# Patient Record
Sex: Female | Born: 1960 | Race: White | Hispanic: No | Marital: Married
Health system: Southern US, Community
[De-identification: ages and names within clinical notes are randomized; demographics above are authoritative.]

## PROBLEM LIST (undated history)

## (undated) DIAGNOSIS — L570 Actinic keratosis: Secondary | ICD-10-CM

## (undated) HISTORY — DX: Actinic keratosis: L57.0

---

## 2004-09-06 ENCOUNTER — Ambulatory Visit: Payer: Self-pay | Admitting: Obstetrics and Gynecology

## 2005-09-28 ENCOUNTER — Ambulatory Visit: Payer: Self-pay | Admitting: Obstetrics and Gynecology

## 2006-10-04 ENCOUNTER — Ambulatory Visit: Payer: Self-pay | Admitting: Obstetrics and Gynecology

## 2007-10-09 ENCOUNTER — Ambulatory Visit: Payer: Self-pay | Admitting: Obstetrics and Gynecology

## 2008-10-22 ENCOUNTER — Ambulatory Visit: Payer: Self-pay | Admitting: Unknown Physician Specialty

## 2009-11-05 ENCOUNTER — Ambulatory Visit: Payer: Self-pay | Admitting: Unknown Physician Specialty

## 2010-01-18 ENCOUNTER — Ambulatory Visit: Payer: Self-pay | Admitting: Unknown Physician Specialty

## 2010-04-01 ENCOUNTER — Ambulatory Visit: Payer: Self-pay | Admitting: Internal Medicine

## 2010-06-24 ENCOUNTER — Ambulatory Visit: Payer: Self-pay | Admitting: Surgery

## 2010-06-28 LAB — PATHOLOGY REPORT

## 2010-08-05 ENCOUNTER — Ambulatory Visit: Payer: Self-pay | Admitting: Urology

## 2010-12-07 ENCOUNTER — Ambulatory Visit: Payer: Self-pay | Admitting: Unknown Physician Specialty

## 2010-12-27 ENCOUNTER — Ambulatory Visit: Payer: Self-pay | Admitting: Internal Medicine

## 2011-12-13 ENCOUNTER — Ambulatory Visit: Payer: Self-pay | Admitting: Obstetrics and Gynecology

## 2012-05-09 ENCOUNTER — Ambulatory Visit: Payer: Self-pay | Admitting: Internal Medicine

## 2012-07-13 ENCOUNTER — Ambulatory Visit: Payer: Self-pay | Admitting: Obstetrics and Gynecology

## 2013-02-12 ENCOUNTER — Ambulatory Visit: Payer: Self-pay | Admitting: Obstetrics and Gynecology

## 2013-05-13 ENCOUNTER — Ambulatory Visit
Admission: RE | Admit: 2013-05-13 | Discharge: 2013-05-13 | Disposition: A | Discharge status: Home / Self Care | Payer: BC Managed Care – PPO | Source: Ambulatory Visit | Attending: Allergy and Immunology | Admitting: Allergy and Immunology

## 2013-05-13 ENCOUNTER — Other Ambulatory Visit: Payer: Self-pay | Admitting: Allergy and Immunology

## 2013-05-13 DIAGNOSIS — R059 Cough, unspecified: Secondary | ICD-10-CM

## 2013-05-13 DIAGNOSIS — R05 Cough: Secondary | ICD-10-CM

## 2014-02-17 ENCOUNTER — Ambulatory Visit: Payer: Self-pay | Admitting: Obstetrics and Gynecology

## 2014-02-18 ENCOUNTER — Ambulatory Visit: Payer: Self-pay | Admitting: Obstetrics and Gynecology

## 2014-02-19 ENCOUNTER — Ambulatory Visit: Payer: Self-pay | Admitting: Obstetrics and Gynecology

## 2014-02-19 HISTORY — PX: BREAST BIOPSY: SHX20

## 2014-03-02 ENCOUNTER — Ambulatory Visit: Payer: Self-pay | Admitting: Surgery

## 2014-03-13 ENCOUNTER — Ambulatory Visit: Payer: Self-pay | Admitting: Surgery

## 2014-03-13 HISTORY — PX: BREAST LUMPECTOMY W/ NEEDLE LOCALIZATION: SHX1266

## 2014-03-13 HISTORY — PX: BREAST EXCISIONAL BIOPSY: SUR124

## 2014-05-04 LAB — SURGICAL PATHOLOGY

## 2014-05-10 NOTE — Op Note (Signed)
PATIENT NAME:  Wanda Taylor, Wanda Taylor MR#:  [id number] DATE OF BIRTH:  1960-12-21  DATE OF PROCEDURE:  03/13/2014  PREOPERATIVE DIAGNOSIS: Intraductal papilloma of left breast.   POSTOPERATIVE DIAGNOSIS: Intraductal papilloma of left breast.   PROCEDURE: Excision, left breast mass.   SURGEON: Loreli Dollar, MD   ANESTHESIA: General.   INDICATIONS: This 54 year old female recently had a mammogram depicting a small cluster of microcalcifications in the retroareolar portion of the left breast. A core biopsy demonstrated intraductal papilloma and fibrocystic changes and mild hyperplasia. Excision was recommended for further evaluation and treatment.   DESCRIPTION OF PROCEDURE: The patient was placed on the operating table in the supine position under general anesthesia. The dressing was removed from the left breast, exposing a Kopans wire, which entered the breast at the border of the areola in the lower outer quadrant. Her mammogram images were reviewed. The wire was cut 2 cm from the skin. The breast was prepared with ChloraPrep and draped in a sterile manner.   A curvilinear incision was made at the border of the areola from approximately 3-o'clock to 6 o'clock position, carried down through subcutaneous tissues. Several small bleeding points were cauterized. Dissection was carried down to encounter the wire. There was also some palpable firmness within the tissue and appearance of dense tissue, also a number of small cysts were encountered. There was also some old blood, which was aspirated. The dissection was carried out around the thick portion of the wire and removed a portion of tissue, which was approximately 1.2 x 1.2 x 2 cm in dimension and was oriented so that a nylon suture was placed on the anterior border and recorded on the pathology requisition that the barb was medially located and submitted this for specimen mammogram and pathology.   The wound was inspected and several small bleeding  points were cauterized. There was somewhat dense breast tissue noted. There appeared to be no other visible or palpable nodules. Next, the subcutaneous tissues were infiltrated with 0.5% Sensorcaine with epinephrine. The subcutaneous tissues were approximated with interrupted 4-0 chromic. The skin was closed with a running 4-0 Monocryl subcuticular suture and LiquiBand. The patient tolerated surgery satisfactorily and was prepared for transfer to the recovery room.     ____________________________ Lenna Sciara. Rochel Brome, MD jws:mw D: 03/13/2014 13:43:36 ET T: 03/13/2014 15:21:10 ET JOB#: [id number]  cc: Loreli Dollar, MD, <Dictator> Loreli Dollar MD ELECTRONICALLY SIGNED 03/19/2014 13:38

## 2015-02-11 ENCOUNTER — Other Ambulatory Visit: Payer: Self-pay | Admitting: Obstetrics and Gynecology

## 2015-02-11 DIAGNOSIS — Z1231 Encounter for screening mammogram for malignant neoplasm of breast: Secondary | ICD-10-CM

## 2015-02-18 ENCOUNTER — Ambulatory Visit
Admission: RE | Admit: 2015-02-18 | Discharge: 2015-02-18 | Disposition: A | Discharge status: Home / Self Care | Payer: BLUE CROSS/BLUE SHIELD | Source: Ambulatory Visit | Attending: Obstetrics and Gynecology | Admitting: Obstetrics and Gynecology

## 2015-02-18 DIAGNOSIS — Z1231 Encounter for screening mammogram for malignant neoplasm of breast: Secondary | ICD-10-CM | POA: Diagnosis not present

## 2015-12-24 ENCOUNTER — Telehealth: Payer: Self-pay | Admitting: Allergy and Immunology

## 2015-12-24 NOTE — Telephone Encounter (Signed)
Called patient and advised that she would need a follow up appointment; then we could take care of any refills that the patient may need. Patient stated that she would call back to make an appointment.

## 2015-12-24 NOTE — Telephone Encounter (Signed)
Pt called and would like a refill on her inhale. Wanda Taylor [phone]

## 2016-01-18 ENCOUNTER — Other Ambulatory Visit: Payer: Self-pay | Admitting: Internal Medicine

## 2016-01-18 DIAGNOSIS — Z1231 Encounter for screening mammogram for malignant neoplasm of breast: Secondary | ICD-10-CM

## 2016-02-22 ENCOUNTER — Ambulatory Visit
Admission: RE | Admit: 2016-02-22 | Discharge: 2016-02-22 | Disposition: A | Discharge status: Home / Self Care | Payer: 59 | Source: Ambulatory Visit | Attending: Internal Medicine | Admitting: Internal Medicine

## 2016-02-22 DIAGNOSIS — Z1231 Encounter for screening mammogram for malignant neoplasm of breast: Secondary | ICD-10-CM | POA: Diagnosis present

## 2016-03-01 DIAGNOSIS — D229 Melanocytic nevi, unspecified: Secondary | ICD-10-CM

## 2016-03-01 HISTORY — DX: Melanocytic nevi, unspecified: D22.9

## 2016-06-30 IMAGING — MG MM DIGITAL SCREENING BILAT W/ CAD
4 series · 4 of 4 positions shown · non-contrast
Comparison: Previous exam(s).

CLINICAL DATA: Screening.

EXAM:
DIGITAL SCREENING BILATERAL MAMMOGRAM WITH CAD

[R CC (1 of 2)]
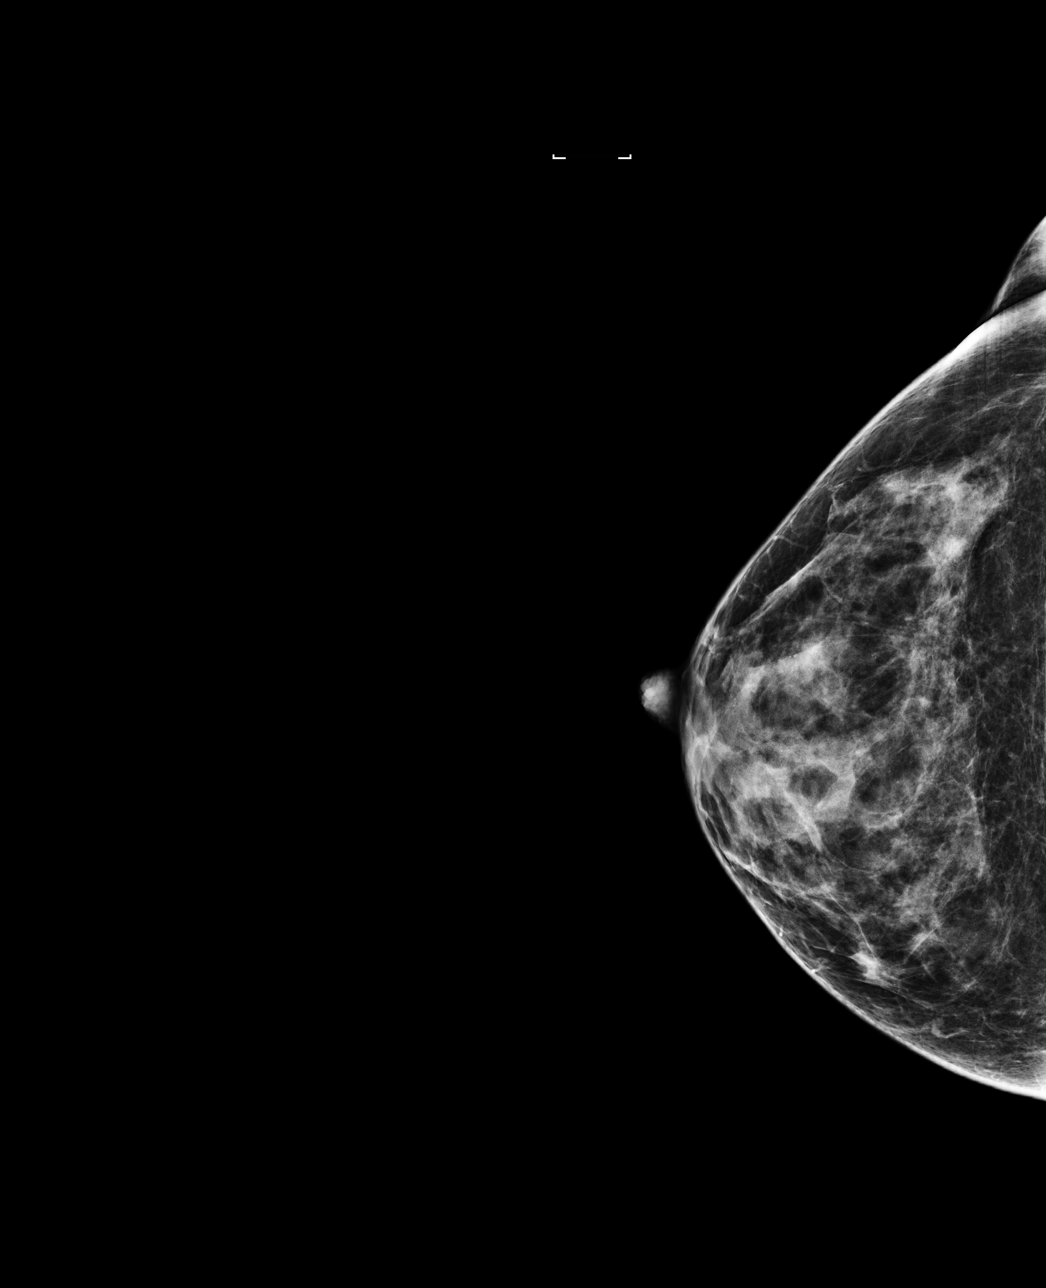

[R CC (2 of 2)]
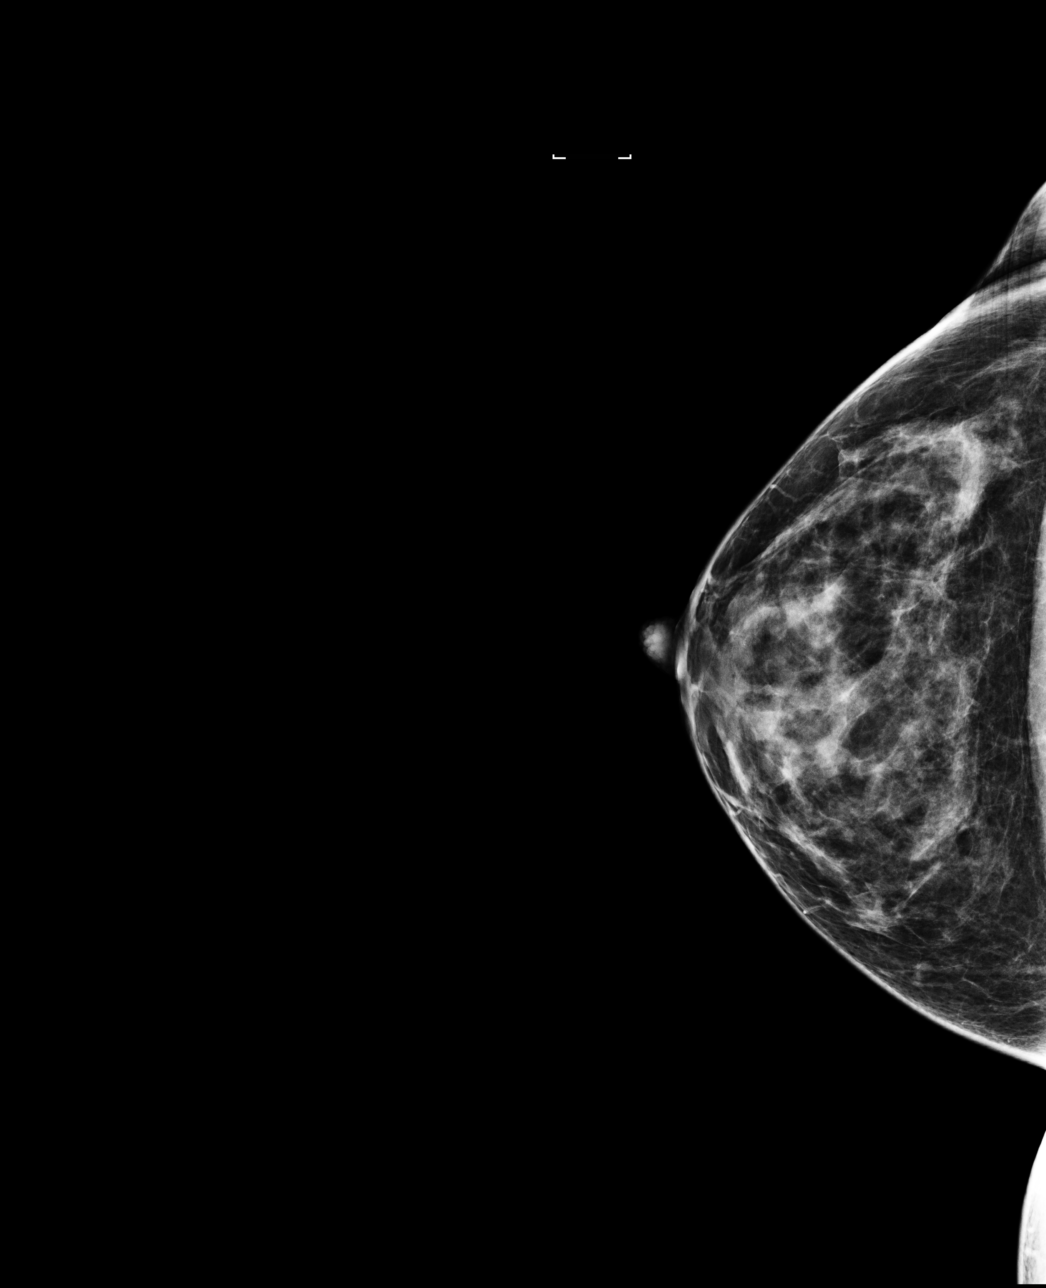

[L MLO]
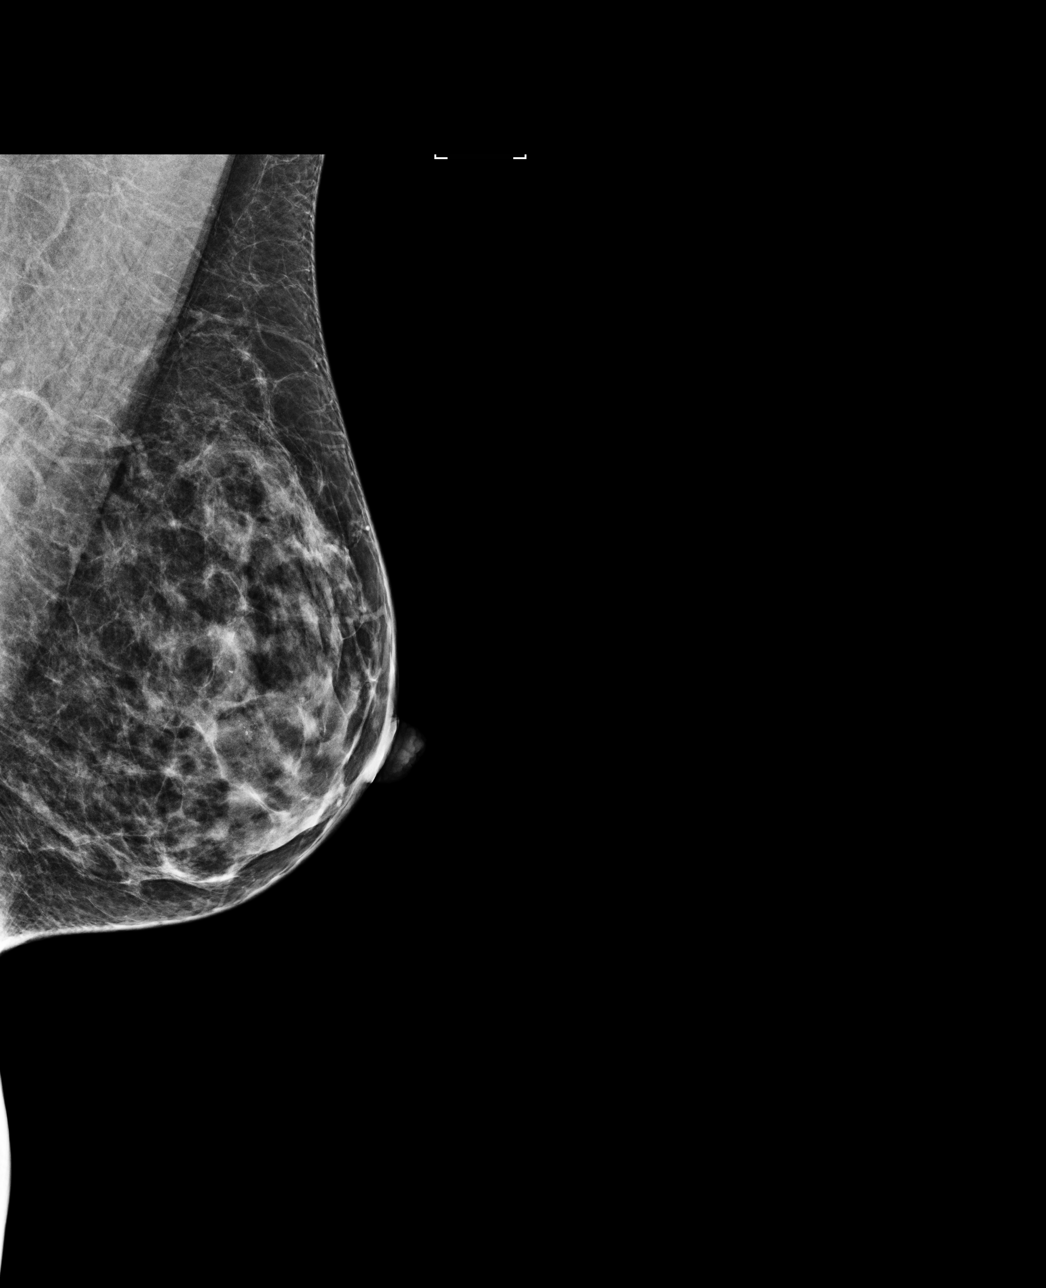

[R MLO]
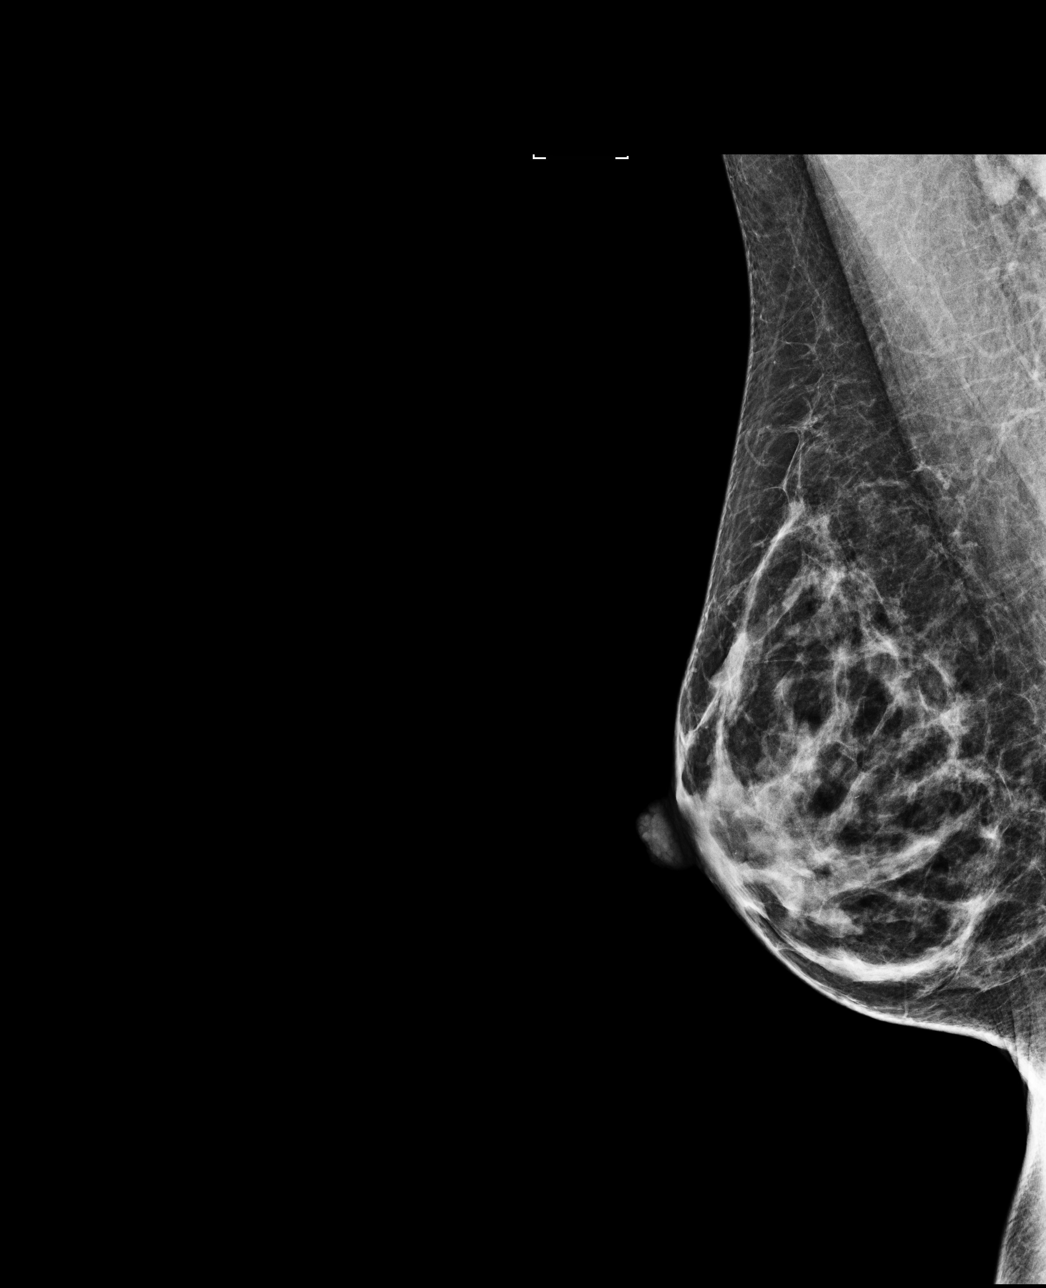

[4 of 4 positions shown; findings below may reference images not displayed]

ACR Breast Density Category c: The breast tissue is heterogeneously
dense, which may obscure small masses.
FINDINGS: In the left breast, calcifications warrant further evaluation with
magnified views. In the right breast, no findings suspicious for
malignancy. Images were processed with CAD.
IMPRESSION: Further evaluation is suggested for calcifications in the left
breast.

RECOMMENDATION:
Diagnostic mammogram of the left breast. (Code:[id number])

The patient will be contacted regarding the findings, and additional
imaging will be scheduled.

BI-RADS CATEGORY  0: Incomplete. Need additional imaging evaluation
and/or prior mammograms for comparison.

## 2016-07-02 IMAGING — MG MM POST US BIOPSY *L*
1 series · 2 of 2 positions shown · non-contrast
Comparison: Previous exam(s).

CLINICAL DATA: Left breast stereotactic biopsy.

EXAM:
DIAGNOSTIC left breast MAMMOGRAM POST stereotactic BIOPSY

[L CC · left · 2 of 2 slices shown]
[im 1/2]
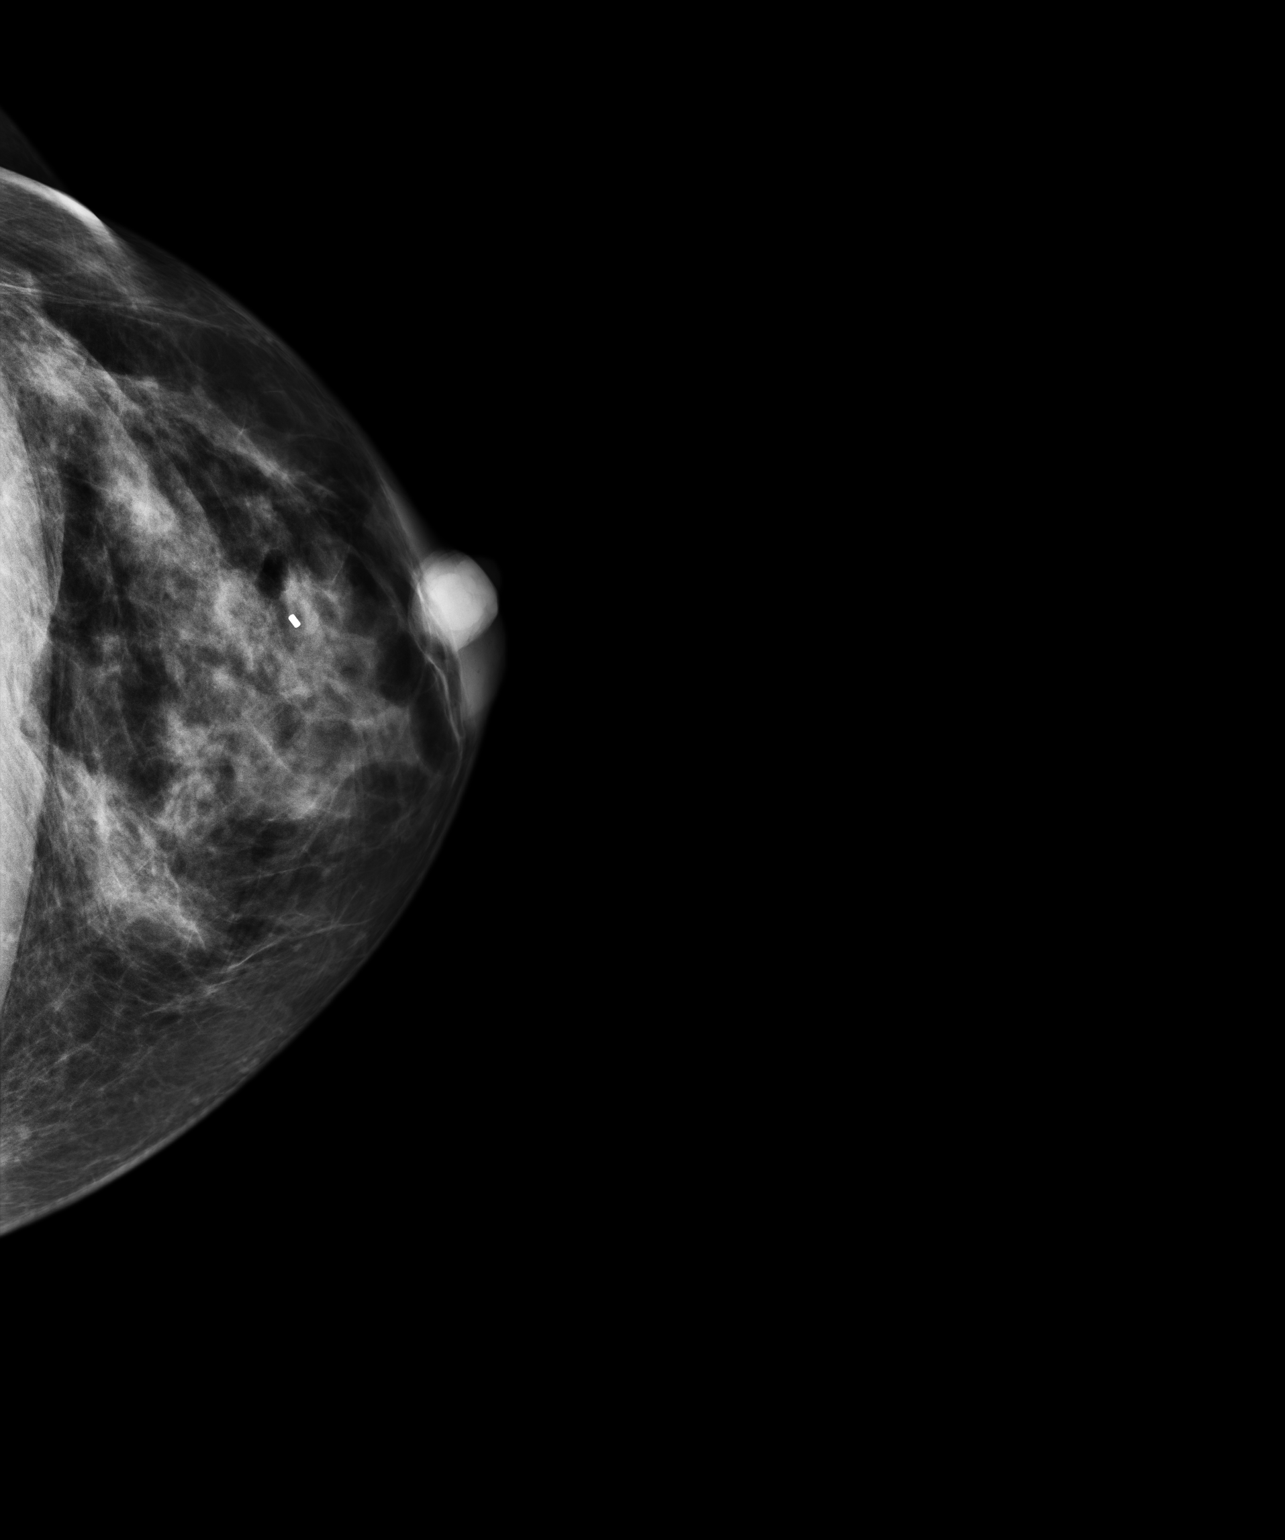
[im 2/2]
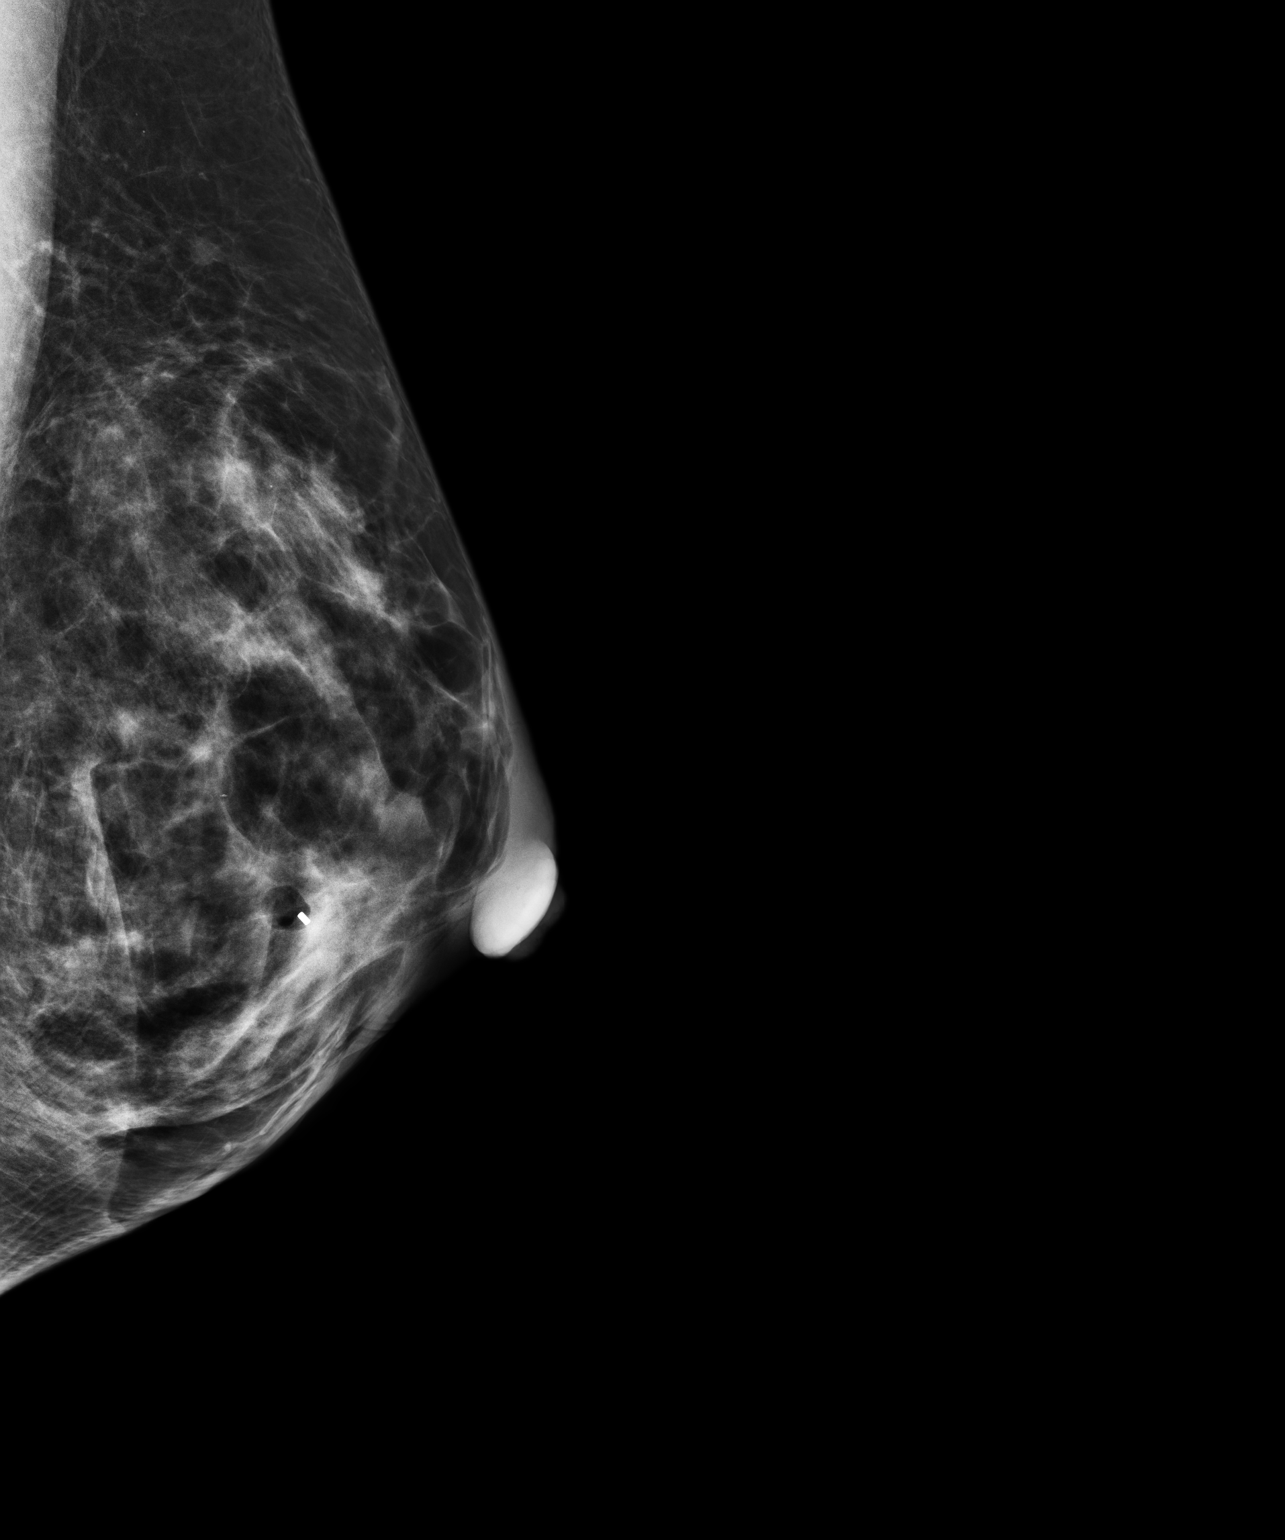

[2 of 2 positions shown; findings below may reference images not displayed]

FINDINGS: Mammographic images were obtained following stereotactic guided
biopsy of the area of calcifications located within the subareolar
portion of the left breast. The cylinder-shaped clip is in
appropriate position.
IMPRESSION: Appropriate position of clip following left breast stereotactic
biopsy.

Final Assessment: Post Procedure Mammograms for Marker Placement

## 2017-01-16 ENCOUNTER — Other Ambulatory Visit: Payer: Self-pay | Admitting: Internal Medicine

## 2017-01-16 DIAGNOSIS — Z1231 Encounter for screening mammogram for malignant neoplasm of breast: Secondary | ICD-10-CM

## 2017-02-27 ENCOUNTER — Ambulatory Visit
Admission: RE | Admit: 2017-02-27 | Discharge: 2017-02-27 | Disposition: A | Discharge status: Home / Self Care | Payer: Self-pay | Source: Ambulatory Visit | Attending: Internal Medicine | Admitting: Internal Medicine

## 2017-02-27 ENCOUNTER — Other Ambulatory Visit: Payer: Self-pay | Admitting: Internal Medicine

## 2017-02-27 DIAGNOSIS — Z1231 Encounter for screening mammogram for malignant neoplasm of breast: Secondary | ICD-10-CM | POA: Insufficient documentation

## 2018-04-09 ENCOUNTER — Other Ambulatory Visit: Payer: Self-pay | Admitting: Internal Medicine

## 2018-04-09 DIAGNOSIS — Z1231 Encounter for screening mammogram for malignant neoplasm of breast: Secondary | ICD-10-CM

## 2018-06-13 ENCOUNTER — Other Ambulatory Visit: Payer: Self-pay

## 2018-06-13 ENCOUNTER — Ambulatory Visit
Admission: RE | Admit: 2018-06-13 | Discharge: 2018-06-13 | Disposition: A | Discharge status: Home / Self Care | Payer: 59 | Source: Ambulatory Visit | Attending: Internal Medicine | Admitting: Internal Medicine

## 2018-06-13 DIAGNOSIS — Z1231 Encounter for screening mammogram for malignant neoplasm of breast: Secondary | ICD-10-CM | POA: Insufficient documentation

## 2019-05-08 ENCOUNTER — Other Ambulatory Visit: Payer: Self-pay | Admitting: Internal Medicine

## 2019-05-08 DIAGNOSIS — Z1231 Encounter for screening mammogram for malignant neoplasm of breast: Secondary | ICD-10-CM

## 2019-06-16 ENCOUNTER — Ambulatory Visit
Admission: RE | Admit: 2019-06-16 | Discharge: 2019-06-16 | Disposition: A | Discharge status: Home / Self Care | Payer: 59 | Source: Ambulatory Visit | Attending: Internal Medicine | Admitting: Internal Medicine

## 2019-06-16 DIAGNOSIS — Z1231 Encounter for screening mammogram for malignant neoplasm of breast: Secondary | ICD-10-CM | POA: Diagnosis not present

## 2020-05-10 ENCOUNTER — Other Ambulatory Visit: Payer: Self-pay | Admitting: Internal Medicine

## 2020-05-10 DIAGNOSIS — Z1231 Encounter for screening mammogram for malignant neoplasm of breast: Secondary | ICD-10-CM

## 2020-07-05 ENCOUNTER — Ambulatory Visit
Admission: RE | Admit: 2020-07-05 | Discharge: 2020-07-05 | Disposition: A | Discharge status: Home / Self Care | Payer: 59 | Source: Ambulatory Visit | Attending: Internal Medicine | Admitting: Internal Medicine

## 2020-07-05 ENCOUNTER — Other Ambulatory Visit: Payer: Self-pay

## 2020-07-05 DIAGNOSIS — Z1231 Encounter for screening mammogram for malignant neoplasm of breast: Secondary | ICD-10-CM | POA: Diagnosis not present

## 2021-03-08 ENCOUNTER — Other Ambulatory Visit: Payer: Self-pay | Admitting: Internal Medicine

## 2021-03-08 DIAGNOSIS — N6323 Unspecified lump in the left breast, lower outer quadrant: Secondary | ICD-10-CM

## 2021-03-24 ENCOUNTER — Ambulatory Visit
Admission: RE | Admit: 2021-03-24 | Discharge: 2021-03-24 | Disposition: A | Discharge status: Home / Self Care | Payer: 59 | Source: Ambulatory Visit | Attending: Internal Medicine | Admitting: Internal Medicine

## 2021-03-24 DIAGNOSIS — N6323 Unspecified lump in the left breast, lower outer quadrant: Secondary | ICD-10-CM | POA: Insufficient documentation

## 2021-05-04 ENCOUNTER — Other Ambulatory Visit: Payer: Self-pay | Admitting: Internal Medicine

## 2021-05-04 DIAGNOSIS — Z1231 Encounter for screening mammogram for malignant neoplasm of breast: Secondary | ICD-10-CM

## 2021-07-13 ENCOUNTER — Ambulatory Visit
Admission: RE | Admit: 2021-07-13 | Discharge: 2021-07-13 | Disposition: A | Discharge status: Home / Self Care | Payer: 59 | Source: Ambulatory Visit | Attending: Internal Medicine | Admitting: Internal Medicine

## 2021-07-13 DIAGNOSIS — Z1231 Encounter for screening mammogram for malignant neoplasm of breast: Secondary | ICD-10-CM | POA: Insufficient documentation

## 2021-10-05 ENCOUNTER — Encounter: Payer: Self-pay | Admitting: Dermatology

## 2021-10-05 ENCOUNTER — Ambulatory Visit (INDEPENDENT_AMBULATORY_CARE_PROVIDER_SITE_OTHER): Payer: Commercial Managed Care - PPO | Admitting: Dermatology

## 2021-10-05 DIAGNOSIS — D1801 Hemangioma of skin and subcutaneous tissue: Secondary | ICD-10-CM

## 2021-10-05 DIAGNOSIS — Z1283 Encounter for screening for malignant neoplasm of skin: Secondary | ICD-10-CM

## 2021-10-05 DIAGNOSIS — L578 Other skin changes due to chronic exposure to nonionizing radiation: Secondary | ICD-10-CM | POA: Diagnosis not present

## 2021-10-05 DIAGNOSIS — Z86018 Personal history of other benign neoplasm: Secondary | ICD-10-CM

## 2021-10-05 DIAGNOSIS — L570 Actinic keratosis: Secondary | ICD-10-CM

## 2021-10-05 DIAGNOSIS — D229 Melanocytic nevi, unspecified: Secondary | ICD-10-CM

## 2021-10-05 DIAGNOSIS — Z808 Family history of malignant neoplasm of other organs or systems: Secondary | ICD-10-CM

## 2021-10-05 DIAGNOSIS — L814 Other melanin hyperpigmentation: Secondary | ICD-10-CM | POA: Diagnosis not present

## 2021-10-05 DIAGNOSIS — L821 Other seborrheic keratosis: Secondary | ICD-10-CM

## 2021-10-05 DIAGNOSIS — D239 Other benign neoplasm of skin, unspecified: Secondary | ICD-10-CM

## 2021-10-05 NOTE — Patient Instructions (Signed)
Recommend daily broad spectrum sunscreen SPF 30+ to sun-exposed areas, reapply every 2 hours as needed. Call for new or changing lesions.  Staying in the shade or wearing long sleeves, sun glasses (UVA+UVB protection) and wide brim hats (4-inch brim around the entire circumference of the hat) are also recommended for sun protection.    Melanoma ABCDEs  Melanoma is the most dangerous type of skin cancer, and is the leading cause of death from skin disease.  You are more likely to develop melanoma if you: Have light-colored skin, light-colored eyes, or red or blond hair Spend a lot of time in the sun Tan regularly, either outdoors or in a tanning bed Have had blistering sunburns, especially during childhood Have a close family member who has had a melanoma Have atypical moles or large birthmarks  Early detection of melanoma is key since treatment is typically straightforward and cure rates are extremely high if we catch it early.   The first sign of melanoma is often a change in a mole or a new dark spot.  The ABCDE system is a way of remembering the signs of melanoma.  A for asymmetry:  The two halves do not match. B for border:  The edges of the growth are irregular. C for color:  A mixture of colors are present instead of an even brown color. D for diameter:  Melanomas are usually (but not always) greater than 6mm - the size of a pencil eraser. E for evolution:  The spot keeps changing in size, shape, and color.  Please check your skin once per month between visits. You can use a small mirror in front and a large mirror behind you to keep an eye on the back side or your body.   If you see any new or changing lesions before your next follow-up, please call to schedule a visit.  Please continue daily skin protection including broad spectrum sunscreen SPF 30+ to sun-exposed areas, reapplying every 2 hours as needed when you're outdoors.   Staying in the shade or wearing long sleeves, sun  glasses (UVA+UVB protection) and wide brim hats (4-inch brim around the entire circumference of the hat) are also recommended for sun protection.     Due to recent changes in healthcare laws, you may see results of your pathology and/or laboratory studies on MyChart before the doctors have had a chance to review them. We understand that in some cases there may be results that are confusing or concerning to you. Please understand that not all results are received at the same time and often the doctors may need to interpret multiple results in order to provide you with the best plan of care or course of treatment. Therefore, we ask that you please give us 2 business days to thoroughly review all your results before contacting the office for clarification. Should we see a critical lab result, you will be contacted sooner.   If You Need Anything After Your Visit  If you have any questions or concerns for your doctor, please call our main line at [phone] and press option 4 to reach your doctor's medical assistant. If no one answers, please leave a voicemail as directed and we will return your call as soon as possible. Messages left after 4 pm will be answered the following business day.   You may also send us a message via MyChart. We typically respond to MyChart messages within 1-2 business days.  For prescription refills, please ask your pharmacy to contact   our office. Our fax number is [phone].  If you have an urgent issue when the clinic is closed that cannot wait until the next business day, you can page your doctor at the number below.    Please note that while we do our best to be available for urgent issues outside of office hours, we are not available 24/7.   If you have an urgent issue and are unable to reach us, you may choose to seek medical care at your doctor's office, retail clinic, urgent care center, or emergency room.  If you have a medical emergency, please immediately  call 911 or go to the emergency department.  Pager Numbers  - Dr. Kowalski: [phone]  - Dr. Moye: [phone]  - Dr. Stewart: [phone]  In the event of inclement weather, please call our main line at [phone] for an update on the status of any delays or closures.  Dermatology Medication Tips: Please keep the boxes that topical medications come in in order to help keep track of the instructions about where and how to use these. Pharmacies typically print the medication instructions only on the boxes and not directly on the medication tubes.   If your medication is too expensive, please contact our office at [phone] option 4 or send us a message through MyChart.   We are unable to tell what your co-pay for medications will be in advance as this is different depending on your insurance coverage. However, we may be able to find a substitute medication at lower cost or fill out paperwork to get insurance to cover a needed medication.   If a prior authorization is required to get your medication covered by your insurance company, please allow us 1-2 business days to complete this process.  Drug prices often vary depending on where the prescription is filled and some pharmacies may offer cheaper prices.  The website www.goodrx.com contains coupons for medications through different pharmacies. The prices here do not account for what the cost may be with help from insurance (it may be cheaper with your insurance), but the website can give you the price if you did not use any insurance.  - You can print the associated coupon and take it with your prescription to the pharmacy.  - You may also stop by our office during regular business hours and pick up a GoodRx coupon card.  - If you need your prescription sent electronically to a different pharmacy, notify our office through [facility] MyChart or by phone at [phone] option 4.     Si Usted Necesita Algo Despus de Su  Visita  Tambin puede enviarnos un mensaje a travs de MyChart. Por lo general respondemos a los mensajes de MyChart en el transcurso de 1 a 2 das hbiles.  Para renovar recetas, por favor pida a su farmacia que se ponga en contacto con nuestra oficina. Nuestro nmero de fax es el [phone].  Si tiene un asunto urgente cuando la clnica est cerrada y que no puede esperar hasta el siguiente da hbil, puede llamar/localizar a su doctor(a) al nmero que aparece a continuacin.   Por favor, tenga en cuenta que aunque hacemos todo lo posible para estar disponibles para asuntos urgentes fuera del horario de oficina, no estamos disponibles las 24 horas del da, los 7 das de la semana.   Si tiene un problema urgente y no puede comunicarse con nosotros, puede optar por buscar atencin mdica  en el consultorio de su doctor(a), en una clnica privada,   en un centro de atencin urgente o en una sala de emergencias.  Si tiene una emergencia mdica, por favor llame inmediatamente al 911 o vaya a la sala de emergencias.  Nmeros de bper  - Dr. Kowalski: [phone]  - Dra. Moye: [phone]  - Dra. Stewart: [phone]  En caso de inclemencias del tiempo, por favor llame a nuestra lnea principal al [phone] para una actualizacin sobre el estado de cualquier retraso o cierre.  Consejos para la medicacin en dermatologa: Por favor, guarde las cajas en las que vienen los medicamentos de uso tpico para ayudarle a seguir las instrucciones sobre dnde y cmo usarlos. Las farmacias generalmente imprimen las instrucciones del medicamento slo en las cajas y no directamente en los tubos del medicamento.   Si su medicamento es muy caro, por favor, pngase en contacto con nuestra oficina llamando al [phone] y presione la opcin 4 o envenos un mensaje a travs de MyChart.   No podemos decirle cul ser su copago por los medicamentos por adelantado ya que esto es diferente dependiendo de la  cobertura de su seguro. Sin embargo, es posible que podamos encontrar un medicamento sustituto a menor costo o llenar un formulario para que el seguro cubra el medicamento que se considera necesario.   Si se requiere una autorizacin previa para que su compaa de seguros cubra su medicamento, por favor permtanos de 1 a 2 das hbiles para completar este proceso.  Los precios de los medicamentos varan con frecuencia dependiendo del lugar de dnde se surte la receta y alguna farmacias pueden ofrecer precios ms baratos.  El sitio web www.goodrx.com tiene cupones para medicamentos de diferentes farmacias. Los precios aqu no tienen en cuenta lo que podra costar con la ayuda del seguro (puede ser ms barato con su seguro), pero el sitio web puede darle el precio si no utiliz ningn seguro.  - Puede imprimir el cupn correspondiente y llevarlo con su receta a la farmacia.  - Tambin puede pasar por nuestra oficina durante el horario de atencin regular y recoger una tarjeta de cupones de GoodRx.  - Si necesita que su receta se enve electrnicamente a una farmacia diferente, informe a nuestra oficina a travs de MyChart de [facility] o por telfono llamando al [phone] y presione la opcin 4.  

## 2021-10-05 NOTE — Progress Notes (Unsigned)
New Patient Visit  Subjective  Wanda Taylor is a 61 y.o. female who presents for the following: Annual Exam (HxDN. Family HxMM, sister). The patient presents for Total-Body Skin Exam (TBSE) for skin cancer screening and mole check.  The patient has spots, moles and lesions to be evaluated, some may be new or changing and the patient has concerns that these could be cancer. Patient has noticed a spot on her nose that gets scaly on and off, but now it seems to be clear.  She would like this evaluated.  Review of Systems: No other skin or systemic complaints except as noted in HPI or Assessment and Plan.  Objective  Well appearing patient in no apparent distress; mood and affect are within normal limits.  A full examination was performed including scalp, head, eyes, ears, nose, lips, neck, chest, axillae, abdomen, back, buttocks, bilateral upper extremities, bilateral lower extremities, hands, feet, fingers, toes, fingernails, and toenails. All findings within normal limits unless otherwise noted below.  Nose Clear today   Assessment & Plan   History of Dysplastic Nevus. Left axilla, mild. 03/01/2016. - No evidence of recurrence today - Recommend regular full body skin exams - Recommend daily broad spectrum sunscreen SPF 30+ to sun-exposed areas, reapply every 2 hours as needed.  - Call if any new or changing lesions are noted between office visits   Family history of skin cancer - what type(s): Melanoma - who affected: Sister  Lentigines - Scattered tan macules - Due to sun exposure - Benign-appearing, observe - Recommend daily broad spectrum sunscreen SPF 30+ to sun-exposed areas, reapply every 2 hours as needed. - Call for any changes  Seborrheic Keratoses - Stuck-on, waxy, tan-brown papules and/or plaques  - Benign-appearing - Discussed benign etiology and prognosis. - Observe - Call for any changes  Melanocytic Nevi - Tan-brown and/or pink-flesh-colored symmetric  macules and papules - Benign appearing on exam today - Observation - Call clinic for new or changing moles - Recommend daily use of broad spectrum spf 30+ sunscreen to sun-exposed areas.   Hemangiomas - Red papules - Discussed benign nature - Observe - Call for any changes  Actinic Damage - Chronic condition, secondary to cumulative UV/sun exposure - diffuse scaly erythematous macules with underlying dyspigmentation - Recommend daily broad spectrum sunscreen SPF 30+ to sun-exposed areas, reapply every 2 hours as needed.  - Staying in the shade or wearing long sleeves, sun glasses (UVA+UVB protection) and wide brim hats (4-inch brim around the entire circumference of the hat) are also recommended for sun protection.  - Call for new or changing lesions.  Call office for work-in visit if scaly lesion on nose appears again.  Skin cancer screening performed today.  Probable waxing and waning AK (actinic keratosis) of the nose.  Currently clear and not present.  Advised if recurs and persistent she should return for reevaluation and possible treatment.  Advised this is not uncommon for actinic keratoses to come and go. Nose Actinic keratoses are precancerous spots that appear secondary to cumulative UV radiation exposure/sun exposure over time. They are chronic with expected duration over 1 year. A portion of actinic keratoses will progress to squamous cell carcinoma of the skin. It is not possible to reliably predict which spots will progress to skin cancer and so treatment is recommended to prevent development of skin cancer.  Recommend daily broad spectrum sunscreen SPF 30+ to sun-exposed areas, reapply every 2 hours as needed.  Recommend staying in the shade or wearing  long sleeves, sun glasses (UVA+UVB protection) and wide brim hats (4-inch brim around the entire circumference of the hat). Call for new or changing lesions. Call office for work-in visit if scaly lesion on nose appears  again.  Return in about 1 year (around 10/06/2022) for TBSE, HxDN. Fm HxMM.  I, Emelia Salisbury, CMA, am acting as scribe for Sarina Ser, MD. Documentation: I have reviewed the above documentation for accuracy and completeness, and I agree with the above.  Sarina Ser, MD

## 2021-10-06 ENCOUNTER — Encounter: Payer: Self-pay | Admitting: Dermatology

## 2022-06-06 ENCOUNTER — Other Ambulatory Visit: Payer: Self-pay | Admitting: Internal Medicine

## 2022-06-06 DIAGNOSIS — Z1231 Encounter for screening mammogram for malignant neoplasm of breast: Secondary | ICD-10-CM

## 2022-06-07 ENCOUNTER — Ambulatory Visit (INDEPENDENT_AMBULATORY_CARE_PROVIDER_SITE_OTHER): Payer: Commercial Managed Care - PPO | Admitting: Dermatology

## 2022-06-07 VITALS — BP 105/71

## 2022-06-07 DIAGNOSIS — L578 Other skin changes due to chronic exposure to nonionizing radiation: Secondary | ICD-10-CM | POA: Diagnosis not present

## 2022-06-07 DIAGNOSIS — Z7189 Other specified counseling: Secondary | ICD-10-CM

## 2022-06-07 DIAGNOSIS — W908XXA Exposure to other nonionizing radiation, initial encounter: Secondary | ICD-10-CM | POA: Diagnosis not present

## 2022-06-07 DIAGNOSIS — X32XXXA Exposure to sunlight, initial encounter: Secondary | ICD-10-CM

## 2022-06-07 DIAGNOSIS — Z5111 Encounter for antineoplastic chemotherapy: Secondary | ICD-10-CM

## 2022-06-07 DIAGNOSIS — L570 Actinic keratosis: Secondary | ICD-10-CM | POA: Diagnosis not present

## 2022-06-07 DIAGNOSIS — Z79899 Other long term (current) drug therapy: Secondary | ICD-10-CM

## 2022-06-07 NOTE — Patient Instructions (Signed)
Instructions for [facility]  One or more of your medications was sent to the [facility] mail order compounding [facility]. You will receive an email from them and can purchase the medicine through that link. It will then be mailed to your home at the address you confirmed. If for any reason you do not receive an email from them, please check your spam folder. If you still do not find the email, please let us know. [facility] phone number is [phone].    Cryotherapy Aftercare  Wash gently with soap and water everyday.   Apply Vaseline and Band-Aid daily until healed.    Due to recent changes in healthcare laws, you may see results of your pathology and/or laboratory studies on MyChart before the doctors have had a chance to review them. We understand that in some cases there may be results that are confusing or concerning to you. Please understand that not all results are received at the same time and often the doctors may need to interpret multiple results in order to provide you with the best plan of care or course of treatment. Therefore, we ask that you please give us 2 business days to thoroughly review all your results before contacting the office for clarification. Should we see a critical lab result, you will be contacted sooner.   If You Need Anything After Your Visit  If you have any questions or concerns for your doctor, please call our main line at [phone] and press option 4 to reach your doctor's medical assistant. If no one answers, please leave a voicemail as directed and we will return your call as soon as possible. Messages left after 4 pm will be answered the following business day.   You may also send us a message via MyChart. We typically respond to MyChart messages within 1-2 business days.  For prescription refills, please ask your pharmacy to contact our office. Our fax number is [phone].  If you have an urgent issue when the clinic  is closed that cannot wait until the next business day, you can page your doctor at the number below.    Please note that while we do our best to be available for urgent issues outside of office hours, we are not available 24/7.   If you have an urgent issue and are unable to reach us, you may choose to seek medical care at your doctor's office, retail clinic, urgent care center, or emergency room.  If you have a medical emergency, please immediately call 911 or go to the emergency department.  Pager Numbers  - Dr. Kowalski: [phone]  - Dr. Moye: [phone]  - Dr. Stewart: [phone]  In the event of inclement weather, please call our main line at [phone] for an update on the status of any delays or closures.  Dermatology Medication Tips: Please keep the boxes that topical medications come in in order to help keep track of the instructions about where and how to use these. Pharmacies typically print the medication instructions only on the boxes and not directly on the medication tubes.   If your medication is too expensive, please contact our office at [phone] option 4 or send us a message through MyChart.   We are unable to tell what your co-pay for medications will be in advance as this is different depending on your insurance coverage. However, we may be able to find a substitute medication at lower cost or fill out paperwork to get insurance to cover   a needed medication.   If a prior authorization is required to get your medication covered by your insurance company, please allow us 1-2 business days to complete this process.  Drug prices often vary depending on where the prescription is filled and some pharmacies may offer cheaper prices.  The website www.goodrx.com contains coupons for medications through different pharmacies. The prices here do not account for what the cost may be with help from insurance (it may be cheaper with your insurance), but the website  can give you the price if you did not use any insurance.  - You can print the associated coupon and take it with your prescription to the pharmacy.  - You may also stop by our office during regular business hours and pick up a GoodRx coupon card.  - If you need your prescription sent electronically to a different pharmacy, notify our office through [place] MyChart or by phone at [phone] option 4.     Si Usted Necesita Algo Despus de Su Visita  Tambin puede enviarnos un mensaje a travs de MyChart. Por lo general respondemos a los mensajes de MyChart en el transcurso de 1 a 2 das hbiles.  Para renovar recetas, por favor pida a su farmacia que se ponga en contacto con nuestra oficina. Nuestro nmero de fax es el [phone].  Si tiene un asunto urgente cuando la clnica est cerrada y que no puede esperar hasta el siguiente da hbil, puede llamar/localizar a su doctor(a) al nmero que aparece a continuacin.   Por favor, tenga en cuenta que aunque hacemos todo lo posible para estar disponibles para asuntos urgentes fuera del horario de oficina, no estamos disponibles las 24 horas del da, los 7 das de la semana.   Si tiene un problema urgente y no puede comunicarse con nosotros, puede optar por buscar atencin mdica  en el consultorio de su doctor(a), en una clnica privada, en un centro de atencin urgente o en una sala de emergencias.  Si tiene una emergencia mdica, por favor llame inmediatamente al 911 o vaya a la sala de emergencias.  Nmeros de bper  - Dr. Kowalski: [phone]  - Dra. Moye: [phone]  - Dra. Stewart: [phone]  En caso de inclemencias del tiempo, por favor llame a nuestra lnea principal al [phone] para una actualizacin sobre el estado de cualquier retraso o cierre.  Consejos para la medicacin en dermatologa: Por favor, guarde las cajas en las que vienen los medicamentos de uso tpico para ayudarle a seguir las instrucciones  sobre dnde y cmo usarlos. Las farmacias generalmente imprimen las instrucciones del medicamento slo en las cajas y no directamente en los tubos del medicamento.   Si su medicamento es muy caro, por favor, pngase en contacto con nuestra oficina llamando al [phone] y presione la opcin 4 o envenos un mensaje a travs de MyChart.   No podemos decirle cul ser su copago por los medicamentos por adelantado ya que esto es diferente dependiendo de la cobertura de su seguro. Sin embargo, es posible que podamos encontrar un medicamento sustituto a menor costo o llenar un formulario para que el seguro cubra el medicamento que se considera necesario.   Si se requiere una autorizacin previa para que su compaa de seguros cubra su medicamento, por favor permtanos de 1 a 2 das hbiles para completar este proceso.  Los precios de los medicamentos varan con frecuencia dependiendo del lugar de dnde se surte la receta y alguna farmacias pueden ofrecer precios ms baratos.    El sitio web www.goodrx.com tiene cupones para medicamentos de diferentes farmacias. Los precios aqu no tienen en cuenta lo que podra costar con la ayuda del seguro (puede ser ms barato con su seguro), pero el sitio web puede darle el precio si no utiliz ningn seguro.  - Puede imprimir el cupn correspondiente y llevarlo con su receta a la farmacia.  - Tambin puede pasar por nuestra oficina durante el horario de atencin regular y recoger una tarjeta de cupones de GoodRx.  - Si necesita que su receta se enve electrnicamente a una farmacia diferente, informe a nuestra oficina a travs de MyChart de [place] o por telfono llamando al [phone] y presione la opcin 4.  

## 2022-06-07 NOTE — Progress Notes (Signed)
   Follow-Up Visit   Subjective  Wanda Taylor is a 62 y.o. female who presents for the following: Scaly spot on nose that has come and gone for several years but came back the end of last week   The following portions of the chart were reviewed this encounter and updated as appropriate: medications, allergies, medical history  Review of Systems:  No other skin or systemic complaints except as noted in HPI or Assessment and Plan.  Objective  Well appearing patient in no apparent distress; mood and affect are within normal limits.   A focused examination was performed of the following areas:   Relevant exam findings are noted in the Assessment and Plan.  Nose Erythematous thin papules/macules with gritty scale.     Assessment & Plan     AK (actinic keratosis) Nose  ACTINIC DAMAGE WITH PRECANCEROUS ACTINIC KERATOSES Counseling for Topical Chemotherapy Management: Patient exhibits: - Severe, confluent actinic changes with pre-cancerous actinic keratoses that is secondary to cumulative UV radiation exposure over time - Condition that is severe; chronic, not at goal. - diffuse scaly erythematous macules and papules with underlying dyspigmentation - Discussed Prescription "Field Treatment" topical Chemotherapy for Severe, Chronic Confluent Actinic Changes with Pre-Cancerous Actinic Keratoses Field treatment involves treatment of an entire area of skin that has confluent Actinic Changes (Sun/ Ultraviolet light damage) and PreCancerous Actinic Keratoses by method of PhotoDynamic Therapy (PDT) and/or prescription Topical Chemotherapy agents such as 5-fluorouracil, 5-fluorouracil/calcipotriene, and/or imiquimod.  The purpose is to decrease the number of clinically evident and subclinical PreCancerous lesions to prevent progression to development of skin cancer by chemically destroying early precancer changes that may or may not be visible.  It has been shown to reduce the risk of  developing skin cancer in the treated area. As a result of treatment, redness, scaling, crusting, and open sores may occur during treatment course. One or more than one of these methods may be used and may have to be used several times to control, suppress and eliminate the PreCancerous changes. Discussed treatment course, expected reaction, and possible side effects. - Recommend daily broad spectrum sunscreen SPF 30+ to sun-exposed areas, reapply every 2 hours as needed.  - Staying in the shade or wearing long sleeves, sun glasses (UVA+UVB protection) and wide brim hats (4-inch brim around the entire circumference of the hat) are also recommended. - Call for new or changing lesions.  Apply 5-fluorouracil/calcipotriene cream twice a day for 7 days to affected areas including nose. Prescription sent to [facility]. Patient advised they will receive an email to purchase the medication online and have it sent to their home. Patient provided with handout reviewing treatment course and side effects and advised to call or message [place] on MyChart with any concerns.   Destruction of lesion - Nose Complexity: simple   Destruction method: cryotherapy   Informed consent: discussed and consent obtained   Timeout:  patient name, date of birth, surgical site, and procedure verified Lesion destroyed using liquid nitrogen: Yes   Region frozen until ice ball extended beyond lesion: Yes   Outcome: patient tolerated procedure well with no complications   Post-procedure details: wound care instructions given      Return for Follow up as scheduled.  I, Joanie Coddington, CMA, am acting as scribe for Armida Sans, MD .   Documentation: I have reviewed the above documentation for accuracy and completeness, and I agree with the above.  Armida Sans, MD

## 2022-06-14 ENCOUNTER — Encounter: Payer: Self-pay | Admitting: Dermatology

## 2022-07-27 ENCOUNTER — Ambulatory Visit
Admission: RE | Admit: 2022-07-27 | Discharge: 2022-07-27 | Disposition: A | Discharge status: Home / Self Care | Payer: Commercial Managed Care - PPO | Source: Ambulatory Visit | Attending: Internal Medicine | Admitting: Internal Medicine

## 2022-07-27 DIAGNOSIS — Z1231 Encounter for screening mammogram for malignant neoplasm of breast: Secondary | ICD-10-CM | POA: Diagnosis not present

## 2022-10-18 ENCOUNTER — Ambulatory Visit: Payer: Commercial Managed Care - PPO | Admitting: Dermatology

## 2022-10-18 ENCOUNTER — Encounter: Payer: Self-pay | Admitting: Dermatology

## 2022-10-18 VITALS — BP 104/70

## 2022-10-18 DIAGNOSIS — L244 Irritant contact dermatitis due to drugs in contact with skin: Secondary | ICD-10-CM

## 2022-10-18 DIAGNOSIS — L821 Other seborrheic keratosis: Secondary | ICD-10-CM

## 2022-10-18 DIAGNOSIS — D1801 Hemangioma of skin and subcutaneous tissue: Secondary | ICD-10-CM

## 2022-10-18 DIAGNOSIS — W908XXA Exposure to other nonionizing radiation, initial encounter: Secondary | ICD-10-CM | POA: Diagnosis not present

## 2022-10-18 DIAGNOSIS — L814 Other melanin hyperpigmentation: Secondary | ICD-10-CM

## 2022-10-18 DIAGNOSIS — Z1283 Encounter for screening for malignant neoplasm of skin: Secondary | ICD-10-CM | POA: Diagnosis not present

## 2022-10-18 DIAGNOSIS — Z872 Personal history of diseases of the skin and subcutaneous tissue: Secondary | ICD-10-CM

## 2022-10-18 DIAGNOSIS — L578 Other skin changes due to chronic exposure to nonionizing radiation: Secondary | ICD-10-CM

## 2022-10-18 DIAGNOSIS — L816 Other disorders of diminished melanin formation: Secondary | ICD-10-CM

## 2022-10-18 DIAGNOSIS — L918 Other hypertrophic disorders of the skin: Secondary | ICD-10-CM | POA: Diagnosis not present

## 2022-10-18 DIAGNOSIS — D229 Melanocytic nevi, unspecified: Secondary | ICD-10-CM

## 2022-10-18 DIAGNOSIS — Z86018 Personal history of other benign neoplasm: Secondary | ICD-10-CM

## 2022-10-18 NOTE — Progress Notes (Signed)
Follow-Up Visit   Subjective  Wanda Taylor is a 62 y.o. female who presents for the following: Skin Cancer Screening and Full Body Skin Exam, check spot R neck, new, no symptoms, check ant neck itchy x 3 wks, hx of AK nose recheck LN2 and 5FU/Calcipotriene, hx of Dysplastic  nevus  The patient presents for Total-Body Skin Exam (TBSE) for skin cancer screening and mole check. The patient has spots, moles and lesions to be evaluated, some may be new or changing and the patient may have concern these could be cancer.    The following portions of the chart were reviewed this encounter and updated as appropriate: medications, allergies, medical history  Review of Systems:  No other skin or systemic complaints except as noted in HPI or Assessment and Plan.  Objective  Well appearing patient in no apparent distress; mood and affect are within normal limits.  A full examination was performed including scalp, head, eyes, ears, nose, lips, neck, chest, axillae, abdomen, back, buttocks, bilateral upper extremities, bilateral lower extremities, hands, feet, fingers, toes, fingernails, and toenails. All findings within normal limits unless otherwise noted below.   Relevant physical exam findings are noted in the Assessment and Plan.    Assessment & Plan   SKIN CANCER SCREENING PERFORMED TODAY.  ACTINIC DAMAGE - Chronic condition, secondary to cumulative UV/sun exposure - diffuse scaly erythematous macules with underlying dyspigmentation - Recommend daily broad spectrum sunscreen SPF 30+ to sun-exposed areas, reapply every 2 hours as needed.  - Staying in the shade or wearing long sleeves, sun glasses (UVA+UVB protection) and wide brim hats (4-inch brim around the entire circumference of the hat) are also recommended for sun protection.  - Call for new or changing lesions.  LENTIGINES, SEBORRHEIC KERATOSES, HEMANGIOMAS - Benign normal skin lesions - Benign-appearing - Call for any  changes  MELANOCYTIC NEVI - Tan-brown and/or pink-flesh-colored symmetric macules and papules - Benign appearing on exam today - Observation - Call clinic for new or changing moles - Recommend daily use of broad spectrum spf 30+ sunscreen to sun-exposed areas.   HISTORY OF DYSPLASTIC NEVUS No evidence of recurrence today Recommend regular full body skin exams Recommend daily broad spectrum sunscreen SPF 30+ to sun-exposed areas, reapply every 2 hours as needed.  Call if any new or changing lesions are noted between office visits  - L axilla  HISTORY OF PRECANCEROUS ACTINIC KERATOSIS - site(s) of PreCancerous Actinic Keratosis clear today. 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 Cause is unknown but it can be a genetic trait. It is not related to vitiligo. There is no treatment. Recommend photoprotection and regular use of spf 30 or higher sunscreen which may help prevent the development of more white  spots.  Treatment Plan:  Benign, observe.     Acrochordons (Skin Tags) R neck, axilla - Fleshy, skin-colored pedunculated papules - Benign  appearing.  - Observe. - If desired, they can be removed with an in office procedure that is not covered by insurance. - Please call the clinic if you notice any new or changing lesions.   Return in about 1 year (around 10/18/2023) for TBSE, Hx of Dysplastic nevi, Hx of AKs.  I, Ardis Rowan, RMA, am acting as scribe for Armida Sans, MD .   Documentation: I have reviewed the above documentation for accuracy and completeness, and I agree with the above.  Armida Sans, MD

## 2022-10-18 NOTE — Patient Instructions (Addendum)
For anterior neck Stop Retin A on neck Start over the counter Hydrocortisone cream until resolved    Due to recent changes in healthcare laws, you may see results of your pathology and/or laboratory studies on MyChart before the doctors have had a chance to review them. We understand that in some cases there may be results that are confusing or concerning to you. Please understand that not all results are received at the same time and often the doctors may need to interpret multiple results in order to provide you with the best plan of care or course of treatment. Therefore, we ask that you please give [place] 2 business days to thoroughly review all your results before contacting the office for clarification. Should we see a critical lab result, you will be contacted sooner.   If You Need Anything After Your Visit  If you have any questions or concerns for your doctor, please call our main line at [phone] and press option 4 to reach your doctor's medical assistant. If no one answers, please leave a voicemail as directed and we will return your call as soon as possible. Messages left after 4 pm will be answered the following business day.   You may also send [place] a message via MyChart. We typically respond to MyChart messages within 1-2 business days.  For prescription refills, please ask your pharmacy to contact our office. Our fax number is [phone].  If you have an urgent issue when the clinic is closed that cannot wait until the next business day, you can page your doctor at the number below.    Please note that while we do our best to be available for urgent issues outside of office hours, we are not available 24/7.   If you have an urgent issue and are unable to reach [place], you may choose to seek medical care at your doctor's office, retail clinic, urgent care center, or emergency room.  If you have a medical emergency, please immediately call 911 or go to the emergency  department.  Pager Numbers  - Dr. Gwen Pounds: [phone]  - Dr. Roseanne Reno: [phone]  - Dr. Katrinka Blazing: [phone]   In the event of inclement weather, please call our main line at [phone] for an update on the status of any delays or closures.  Dermatology Medication Tips: Please keep the boxes that topical medications come in in order to help keep track of the instructions about where and how to use these. Pharmacies typically print the medication instructions only on the boxes and not directly on the medication tubes.   If your medication is too expensive, please contact our office at [phone] option 4 or send [place] a message through MyChart.   We are unable to tell what your co-pay for medications will be in advance as this is different depending on your insurance coverage. However, we may be able to find a substitute medication at lower cost or fill out paperwork to get insurance to cover a needed medication.   If a prior authorization is required to get your medication covered by your insurance company, please allow [place] 1-2 business days to complete this process.  Drug prices often vary depending on where the prescription is filled and some pharmacies may offer cheaper prices.  The website www.goodrx.com contains coupons for medications through different pharmacies. The prices here do not account for what the cost may be with help from insurance (it may be cheaper with your insurance), but the website can give you the  price if you did not use any insurance.  - You can print the associated coupon and take it with your prescription to the pharmacy.  - You may also stop by our office during regular business hours and pick up a GoodRx coupon card.  - If you need your prescription sent electronically to a different pharmacy, notify our office through [facility] or by phone at [phone] option 4.     Si Usted Necesita Algo Despus de Su Visita  Tambin puede enviarnos  un mensaje a travs de Clinical cytogeneticist. Por lo general respondemos a los mensajes de MyChart en el transcurso de 1 a 2 das hbiles.  Para renovar recetas, por favor pida a su farmacia que se ponga en contacto con nuestra oficina. Annie Sable de fax es [place] [phone].  Si tiene un asunto urgente cuando la clnica est cerrada y que no puede esperar hasta el siguiente da hbil, puede llamar/localizar a su doctor(a) al nmero que aparece a continuacin.   Por favor, tenga en cuenta que aunque hacemos todo lo posible para estar disponibles para asuntos urgentes fuera del horario de [place], no estamos disponibles las 24 horas del da, los 7 [address]  [address] de la [place].   Si tiene un problema urgente y no puede comunicarse con nosotros, puede optar por buscar atencin mdica  en el consultorio de su doctor(a), en una clnica privada, en un centro de atencin urgente o en una sala de emergencias.  Si tiene Engineer, drilling, por favor llame inmediatamente al 911 o vaya a la sala de emergencias.  Nmeros de bper  - Dr. Gwen Pounds: [phone]  - Dra. Roseanne Reno: [phone]  - Dr. Katrinka Blazing: [phone]   En caso de inclemencias del tiempo, por favor llame a Lacy Duverney principal al [phone] para una actualizacin sobre el [place] de cualquier retraso o cierre.  Consejos para la medicacin en dermatologa: Por favor, guarde las cajas en las que vienen los medicamentos de uso tpico para ayudarle a seguir las instrucciones sobre dnde y cmo usarlos. Las farmacias generalmente imprimen las instrucciones del medicamento slo en las cajas y no directamente en los tubos del [place].   Si su medicamento es muy caro, por favor, pngase en contacto con Rolm [place]ala llamando al [phone] y presione la opcin 4 o envenos un mensaje a travs de Clinical cytogeneticist.   No podemos decirle cul ser su copago por los medicamentos por adelantado ya que esto es diferente dependiendo de la cobertura de su seguro. Sin  embargo, es posible que podamos encontrar un medicamento sustituto a Audiological scientist un formulario para que el seguro cubra el medicamento que se considera necesario.   Si se requiere una autorizacin previa para que su compaa de seguros [place] su medicamento, por favor permtanos de 1 a 2 das hbiles para completar [address]  Los precios de los medicamentos varan con frecuencia dependiendo del Environmental consultant de dnde se surte la receta y alguna farmacias pueden ofrecer precios ms baratos.  El sitio web www.goodrx.com tiene cupones para medicamentos de Health and safety inspector. Los precios aqu no tienen en cuenta lo que podra costar con la ayuda del seguro (puede ser ms barato con su seguro), pero el sitio web puede darle el precio si no utiliz Tourist information centre manager.  - Puede imprimir el cupn correspondiente y llevarlo con su receta a la farmacia.  - Tambin puede pasar por nuestra oficina durante el horario de atencin regular y Education officer, museum una tarjeta de cupones de GoodRx.  - Si necesita  que su receta se enve electrnicamente a Psychiatrist, informe a nuestra oficina a travs de MyChart de [place] o por telfono llamando al [phone] y presione la opcin 4.

## 2023-06-19 ENCOUNTER — Other Ambulatory Visit: Payer: Self-pay | Admitting: Internal Medicine

## 2023-06-19 DIAGNOSIS — Z1231 Encounter for screening mammogram for malignant neoplasm of breast: Secondary | ICD-10-CM

## 2023-07-10 ENCOUNTER — Ambulatory Visit: Admitting: Dermatology

## 2023-07-18 ENCOUNTER — Ambulatory Visit (INDEPENDENT_AMBULATORY_CARE_PROVIDER_SITE_OTHER): Admitting: Dermatology

## 2023-07-18 DIAGNOSIS — L820 Inflamed seborrheic keratosis: Secondary | ICD-10-CM

## 2023-07-18 DIAGNOSIS — Z7189 Other specified counseling: Secondary | ICD-10-CM

## 2023-07-18 DIAGNOSIS — H019 Unspecified inflammation of eyelid: Secondary | ICD-10-CM | POA: Diagnosis not present

## 2023-07-18 DIAGNOSIS — R21 Rash and other nonspecific skin eruption: Secondary | ICD-10-CM

## 2023-07-18 DIAGNOSIS — Z79899 Other long term (current) drug therapy: Secondary | ICD-10-CM

## 2023-07-18 DIAGNOSIS — H01119 Allergic dermatitis of unspecified eye, unspecified eyelid: Secondary | ICD-10-CM

## 2023-07-18 MED ORDER — PIMECROLIMUS 1 % EX CREA: TOPICAL_CREAM | Freq: Two times a day (BID) | CUTANEOUS | 1 refills | Status: AC

## 2023-07-18 NOTE — Progress Notes (Deleted)
   Follow-Up Visit   Subjective  Wanda Taylor is a 63 y.o. female who presents for the following: The patient has spot on her upper lip to be evaluated that will flare up when she is out in the sun with a tingling sensation then will go away in about 3 days, this area will come and go. Check a rash that will come and go on her right eyebrow, sometimes her eyelid will swell.   The following portions of the chart were reviewed this encounter and updated as appropriate: medications, allergies, medical history  Review of Systems:  No other skin or systemic complaints except as noted in HPI or Assessment and Plan.  Objective  Well appearing patient in no apparent distress; mood and affect are within normal limits.  A focused examination was performed of the following areas: face  Relevant exam findings are noted in the Assessment and Plan.  right upper lip Thin slightly scaly patch    Assessment & Plan   LICHENOID KERATOSIS right upper lip Start Elidel  cream apply qd-bid   May consider Molecular study       Eyelid Dermatitis Right upper eye Exam: Scaly pink papules coalescing to plaques  Treatment Plan: Start Elidel  cream apply qd-bid   If no better may consider patch testing in the future   Return in about 3 months (around 10/18/2023) for dermatitis.  IFay Kirks, CMA, am acting as scribe for Alm Rhyme, MD .   Documentation: I have reviewed the above documentation for accuracy and completeness, and I agree with the above.  Alm Rhyme, MD   .

## 2023-07-18 NOTE — Patient Instructions (Addendum)

## 2023-07-18 NOTE — Progress Notes (Unsigned)
   Follow-Up Visit   Subjective  Wanda Taylor is a 63 y.o. female who presents for the following: The patient has spot on her upper lip to be evaluated that will flare up when she is out in the sun with a tingling sensation then will go away in about 3 days, this area will come and go. Check a rash that will come and go on her right eyebrow, sometimes her eyelid will swell.  The following portions of the chart were reviewed this encounter and updated as appropriate: medications, allergies, medical history  Review of Systems:  No other skin or systemic complaints except as noted in HPI or Assessment and Plan.  Objective  Well appearing patient in no apparent distress; mood and affect are within normal limits.  A focused examination was performed of the following areas: face   Relevant exam findings are noted in the Assessment and Plan.  right upper lip vs Contact Dermatitis vs Gold Allergy rash vs Unusual presentation of HSV of lip vs Early AK vs Eczematous reaction Thin slightly scaly patch  R upper lip with mild pinkness 1 x 0.7 cm      Assessment & Plan   Eyelid Dermatitis  Gold allergy vs Eczema vs other Right upper eyelid/ below eyebrow Exam: Scaly pink papules coalescing to plaques Treatment Plan: Start Elidel  cream apply twice a day    If no better may consider patch testing in the future   LICHENOID KERATOSIS right upper lip vs Contact Dermatitis vs Gold Allergy rash vs Unusual presentation of HSV of lip vs Early AK vs Eczematous reaction Exacerbated by sun exposure.  Symptoms of burning and tingling.  No blistering.No weeping.   + sensitivity, redness, peeling,   Lasts about 2 days or so, then resolves completely.  Occurs in same site every time.  No hx of fever blisters.  Thin slightly scaly patch  R upper lip with mild pinkness 1x0.7 cm  Dr Jackquline evaluated the pt with me today and we discussed the Ddx and diagnostic testing and treatment options.  Start  Elidel  cream apply twice a day   May consider Molecular study in the future when flared more to R/O HSV May consider Patch Testing x 36 especially if lip and eyelid persist or frequently recur.  Patient instructed to send a mychart message in a few weeks with update on progress. Also send MyChart message if lip flares to get worked  in  for evaluation and possible molecular study.   RASH   COUNSELING AND COORDINATION OF CARE   MEDICATION MANAGEMENT   EYELID DERMATITIS, ALLERGIC/CONTACT     Return in about 3 months (around 10/18/2023) for dermatitis.  IFay Kirks, CMA, am acting as scribe for Alm Rhyme, MD .   Documentation: I have reviewed the above documentation for accuracy and completeness, and I agree with the above.  Alm Rhyme, MD

## 2023-07-19 ENCOUNTER — Encounter: Payer: Self-pay | Admitting: Dermatology

## 2023-08-07 ENCOUNTER — Ambulatory Visit
Admission: RE | Admit: 2023-08-07 | Discharge: 2023-08-07 | Disposition: A | Discharge status: Home / Self Care | Source: Ambulatory Visit | Attending: Internal Medicine | Admitting: Internal Medicine

## 2023-08-07 DIAGNOSIS — Z1231 Encounter for screening mammogram for malignant neoplasm of breast: Secondary | ICD-10-CM | POA: Insufficient documentation

## 2023-09-14 ENCOUNTER — Ambulatory Visit: Admitting: Family Medicine

## 2023-10-24 ENCOUNTER — Ambulatory Visit (INDEPENDENT_AMBULATORY_CARE_PROVIDER_SITE_OTHER): Payer: Commercial Managed Care - PPO | Admitting: Dermatology

## 2023-10-24 ENCOUNTER — Encounter: Payer: Self-pay | Admitting: Dermatology

## 2023-10-24 DIAGNOSIS — Z808 Family history of malignant neoplasm of other organs or systems: Secondary | ICD-10-CM

## 2023-10-24 DIAGNOSIS — L821 Other seborrheic keratosis: Secondary | ICD-10-CM

## 2023-10-24 DIAGNOSIS — L578 Other skin changes due to chronic exposure to nonionizing radiation: Secondary | ICD-10-CM

## 2023-10-24 DIAGNOSIS — L814 Other melanin hyperpigmentation: Secondary | ICD-10-CM | POA: Diagnosis not present

## 2023-10-24 DIAGNOSIS — Z1283 Encounter for screening for malignant neoplasm of skin: Secondary | ICD-10-CM | POA: Diagnosis not present

## 2023-10-24 DIAGNOSIS — D1801 Hemangioma of skin and subcutaneous tissue: Secondary | ICD-10-CM

## 2023-10-24 DIAGNOSIS — L918 Other hypertrophic disorders of the skin: Secondary | ICD-10-CM

## 2023-10-24 DIAGNOSIS — W908XXA Exposure to other nonionizing radiation, initial encounter: Secondary | ICD-10-CM | POA: Diagnosis not present

## 2023-10-24 DIAGNOSIS — D229 Melanocytic nevi, unspecified: Secondary | ICD-10-CM

## 2023-10-24 DIAGNOSIS — Z86018 Personal history of other benign neoplasm: Secondary | ICD-10-CM

## 2023-10-24 NOTE — Progress Notes (Signed)
   Follow-Up Visit   Subjective  Wanda Taylor is a 63 y.o. female who presents for the following: Skin Cancer Screening and Full Body Skin Exam Hx of dysplastic nevus, hx of aks , hx of isks  The patient presents for Total-Body Skin Exam (TBSE) for skin cancer screening and mole check. The patient has spots, moles and lesions to be evaluated, some may be new or changing and the patient may have concern these could be cancer.  The following portions of the chart were reviewed this encounter and updated as appropriate: medications, allergies, medical history  Review of Systems:  No other skin or systemic complaints except as noted in HPI or Assessment and Plan.  Objective  Well appearing patient in no apparent distress; mood and affect are within normal limits.  A full examination was performed including scalp, head, eyes, ears, nose, lips, neck, chest, axillae, abdomen, back, buttocks, bilateral upper extremities, bilateral lower extremities, hands, feet, fingers, toes, fingernails, and toenails. All findings within normal limits unless otherwise noted below.   Relevant physical exam findings are noted in the Assessment and Plan.    Assessment & Plan   HISTORY OF DYSPLASTIC NEVUS 03/01/2016 L axilla - mild atypia  No evidence of recurrence today Recommend regular full body skin exams Recommend daily broad spectrum sunscreen SPF 30+ to sun-exposed areas, reapply every 2 hours as needed.  Call if any new or changing lesions are noted between office visits   Family history of skin cancer - what type(s): Melanoma - who affected: Sister  SKIN CANCER SCREENING PERFORMED TODAY.  ACTINIC DAMAGE - Chronic condition, secondary to cumulative UV/sun exposure - diffuse scaly erythematous macules with underlying dyspigmentation - Recommend daily broad spectrum sunscreen SPF 30+ to sun-exposed areas, reapply every 2 hours as needed.  - Staying in the shade or wearing long sleeves, sun  glasses (UVA+UVB protection) and wide brim hats (4-inch brim around the entire circumference of the hat) are also recommended for sun protection.  - Call for new or changing lesions.  LENTIGINES, SEBORRHEIC KERATOSES, HEMANGIOMAS - Benign normal skin lesions - Benign-appearing - Call for any changes  MELANOCYTIC NEVI - Tan-brown and/or pink-flesh-colored symmetric macules and papules - Benign appearing on exam today - Observation - Call clinic for new or changing moles - Recommend daily use of broad spectrum spf 30+ sunscreen to sun-exposed areas.   Acrochordons (Skin Tags) Right neck, axilla - Fleshy, skin-colored pedunculated papules - Benign appearing.  - Observe. - If desired, they can be removed with an in office procedure that is not covered by insurance. - Please call the clinic if you notice any new or changing lesions.   Return in about 1 year (around 10/23/2024) for TBSE.  IEleanor Blush, CMA, am acting as scribe for Alm Rhyme, MD.   Documentation: I have reviewed the above documentation for accuracy and completeness, and I agree with the above.  Alm Rhyme, MD

## 2023-10-24 NOTE — Patient Instructions (Signed)

## 2023-11-29 ENCOUNTER — Ambulatory Visit: Payer: Self-pay

## 2024-10-23 ENCOUNTER — Ambulatory Visit: Admitting: Dermatology
